# Patient Record
Sex: Female | Born: 2000 | Race: Black or African American | Hispanic: No | Marital: Single
Health system: Southern US, Community
[De-identification: ages and names within clinical notes are randomized; demographics above are authoritative.]

## PROBLEM LIST (undated history)

## (undated) DIAGNOSIS — R569 Unspecified convulsions: Secondary | ICD-10-CM

## (undated) DIAGNOSIS — F32A Depression, unspecified: Secondary | ICD-10-CM

## (undated) DIAGNOSIS — F329 Major depressive disorder, single episode, unspecified: Secondary | ICD-10-CM

## (undated) DIAGNOSIS — F4310 Post-traumatic stress disorder, unspecified: Secondary | ICD-10-CM

## (undated) DIAGNOSIS — F909 Attention-deficit hyperactivity disorder, unspecified type: Secondary | ICD-10-CM

---
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
## 2020-06-07 ENCOUNTER — Emergency Department: Admit: 2020-06-07 | Payer: PRIVATE HEALTH INSURANCE

## 2020-06-07 ENCOUNTER — Encounter: Admit: 2020-06-07 | Payer: PRIVATE HEALTH INSURANCE | Attending: Family

## 2020-06-07 ENCOUNTER — Inpatient Hospital Stay: Admit: 2020-06-07 | Discharge: 2020-06-08 | Payer: MEDICAID

## 2020-06-07 DIAGNOSIS — R197 Diarrhea, unspecified: Secondary | ICD-10-CM

## 2020-06-07 DIAGNOSIS — R1011 Right upper quadrant pain: Secondary | ICD-10-CM

## 2020-06-07 MED ORDER — MORPHINE 4 MG/ML INTRAVENOUS SOLUTION
4 mg/mL | Freq: Once | INTRAVENOUS | Status: CP
Start: 2020-06-07 — End: ?
  Administered 2020-06-08: 04:00:00 4 mL via INTRAVENOUS

## 2020-06-07 MED ORDER — SODIUM CHLORIDE 0.9 % BOLUS (NEW BAG)
0.9 % | Freq: Once | INTRAVENOUS | Status: CP
Start: 2020-06-07 — End: ?
  Administered 2020-06-08: 04:00:00 0.9 mL/h via INTRAVENOUS

## 2020-06-08 ENCOUNTER — Emergency Department: Admit: 2020-06-08 | Payer: PRIVATE HEALTH INSURANCE

## 2020-06-08 ENCOUNTER — Inpatient Hospital Stay: Admit: 2020-06-08 | Payer: PRIVATE HEALTH INSURANCE

## 2020-06-08 DIAGNOSIS — R1011 Right upper quadrant pain: Secondary | ICD-10-CM

## 2020-06-08 DIAGNOSIS — R197 Diarrhea, unspecified: Secondary | ICD-10-CM

## 2020-06-08 LAB — URINALYSIS WITH CULTURE REFLEX      (BH LMW YH)
BKR BILIRUBIN, UA: NEGATIVE
BKR BLOOD, UA: NEGATIVE % (ref 17.0–50.0)
BKR GLUCOSE, UA: NEGATIVE
BKR KETONES, UA: NEGATIVE
BKR LEUKOCYTE ESTERASE, UA: NEGATIVE % (ref 0.0–5.0)
BKR NITRITE, UA: NEGATIVE
BKR PH, UA: 7.5 (ref 5.5–7.5)
BKR SPECIFIC GRAVITY, UA: 1.024 g/dL (ref 1.005–1.030)
BKR UROBILINOGEN, UA: <2 EU/dL (ref <=2.0)
BKR WAM RDW-CV: 7.5 % (ref 5.5–7.5)

## 2020-06-08 LAB — CBC WITH AUTO DIFFERENTIAL
BKR WAM ABSOLUTE IMMATURE GRANULOCYTES.: 0.05 x 1000/??L (ref 0.00–0.30)
BKR WAM ABSOLUTE LYMPHOCYTE COUNT.: 1.85 x 1000/??L (ref 0.60–3.70)
BKR WAM ABSOLUTE NRBC (2 DEC): 0 x 1000/??L (ref 0.00–1.00)
BKR WAM ANALYZER ANC: 7.18 x 1000/??L (ref 2.00–7.60)
BKR WAM BASOPHIL ABSOLUTE COUNT.: 0.03 x 1000/??L (ref 0.00–1.00)
BKR WAM BASOPHILS: 0.3 % (ref 0.0–1.4)
BKR WAM EOSINOPHIL ABSOLUTE COUNT.: 0.12 x 1000/??L (ref 0.00–1.00)
BKR WAM EOSINOPHILS: 1.2 % (ref 0.0–5.0)
BKR WAM HEMATOCRIT (2 DEC): 37.5 % (ref 35.00–45.00)
BKR WAM HEMOGLOBIN: 12.6 g/dL (ref 11.7–15.5)
BKR WAM IMMATURE GRANULOCYTES: 0.5 % (ref 0.0–1.0)
BKR WAM LYMPHOCYTES: 18 % (ref 17.0–50.0)
BKR WAM MCH (PG): 30.1 pg (ref 27.0–33.0)
BKR WAM MCHC: 33.6 g/dL (ref 31.0–36.0)
BKR WAM MCV: 89.7 fL (ref 80.0–100.0)
BKR WAM MONOCYTE ABSOLUTE COUNT.: 1.07 x 1000/??L — ABNORMAL HIGH (ref 0.00–1.00)
BKR WAM MONOCYTES: 10.4 % (ref 4.0–12.0)
BKR WAM MPV: 10.7 fL (ref 8.0–12.0)
BKR WAM NEUTROPHILS: 69.6 % (ref 39.0–72.0)
BKR WAM NUCLEATED RED BLOOD CELLS: 0 % (ref 0.0–1.0)
BKR WAM PLATELETS: 230 x1000/??L (ref 150–420)
BKR WAM RED BLOOD CELL COUNT.: 4.18 M/ÂµL (ref 4.00–6.00)
BKR WAM WHITE BLOOD CELL COUNT: 10.3 x1000/ÂµL (ref 4.0–11.0)

## 2020-06-08 LAB — BASIC METABOLIC PANEL
BKR ANION GAP: 10 g/dL (ref 7–17)
BKR AST/ALT RATIO: 141 mmol/L (ref 136–144)
BKR BUN / CREAT RATIO: 13 (ref 8.0–23.0)
BKR CALCIUM: 9 mg/dL (ref 8.8–10.2)
BKR CHLORIDE: 106 mmol/L (ref 98–107)
BKR CREATININE: 0.69 mg/dL (ref 0.40–1.30)
BKR EGFR (AFR AMER): >60 mL/min/{1.73_m2} (ref >60)
BKR EGFR (NON AFRICAN AMERICAN): >60 mL/min/{1.73_m2} (ref >60)
BKR GLUCOSE: 94 mg/dL (ref 70–100)
BKR POTASSIUM: 4.4 mmol/L (ref 3.3–5.3)
BKR SODIUM: 141 mmol/L (ref 136–144)

## 2020-06-08 LAB — LIPASE
BKR BILIRUBIN DIRECT: 35 U/L (ref 11–55)
BKR LIPASE: 35 U/L (ref 11–55)

## 2020-06-08 LAB — HEPATIC FUNCTION PANEL
BKR A/G RATIO: 1.9 (ref 1.0–2.2)
BKR ALANINE AMINOTRANSFERASE (ALT): 16 U/L (ref 10–35)
BKR ALBUMIN: 4.4 g/dL (ref 3.6–4.9)
BKR ALKALINE PHOSPHATASE: 88 U/L (ref 9–122)
BKR ASPARTATE AMINOTRANSFERASE (AST): 21 U/L (ref 10–35)
BKR BILIRUBIN TOTAL: 0.2 mg/dL (ref <=1.2)
BKR CO2: 2.3 g/dL (ref 2.3–3.5)
BKR GLOBULIN: 2.3 g/dL (ref 2.3–3.5)
BKR PROTEIN TOTAL: 6.7 g/dL (ref 6.6–8.7)

## 2020-06-08 LAB — UA REFLEX CULTURE

## 2020-06-08 LAB — SARS COV-2 (COVID-19) RNA-~~LOC~~ LABS (BH GH LMW YH): BKR SARS-COV-2 RNA (COVID-19) (YH): NEGATIVE

## 2020-06-08 MED ORDER — OXYCODONE IMMEDIATE RELEASE 5 MG TABLET
5 mg | ORAL | Status: DC | PRN
Start: 2020-06-08 — End: 2020-06-09
  Administered 2020-06-08 – 2020-06-09 (×2): 5 mg via ORAL

## 2020-06-08 MED ORDER — SODIUM CHLORIDE 0.9 % (FLUSH) INJECTION SYRINGE
0.9 % | Freq: Three times a day (TID) | INTRAVENOUS | Status: DC
Start: 2020-06-08 — End: 2020-06-09

## 2020-06-08 MED ORDER — SODIUM CHLORIDE 0.9 % INTRAVENOUS SOLUTION
INTRAVENOUS | Status: DC
Start: 2020-06-08 — End: 2020-06-09
  Administered 2020-06-08: 09:00:00 via INTRAVENOUS

## 2020-06-08 MED ORDER — ACETAMINOPHEN 325 MG TABLET
325 mg | Freq: Four times a day (QID) | ORAL | Status: DC | PRN
Start: 2020-06-08 — End: 2020-06-09
  Administered 2020-06-08: 16:00:00 325 mg via ORAL

## 2020-06-08 MED ORDER — IOHEXOL (OMNIPAQUE) 350 MG/ML ORAL SOLUTION 25 ML IN WATER 900 ML
Freq: Once | ORAL | Status: CP
Start: 2020-06-08 — End: ?
  Administered 2020-06-08: 09:00:00 900.000 mL via ORAL

## 2020-06-08 MED ORDER — SODIUM CHLORIDE 0.9 % BOLUS (NEW BAG)
0.9 % | Freq: Once | INTRAVENOUS | Status: CP
Start: 2020-06-08 — End: ?
  Administered 2020-06-08: 12:00:00 0.9 mL/h via INTRAVENOUS

## 2020-06-08 MED ORDER — SODIUM CHLORIDE 0.9 % (FLUSH) INJECTION SYRINGE
0.9 % | INTRAVENOUS | Status: DC | PRN
Start: 2020-06-08 — End: 2020-06-09

## 2020-06-08 MED ORDER — MORPHINE 4 MG/ML INTRAVENOUS SOLUTION
4 mg/mL | SUBCUTANEOUS | Status: DC | PRN
Start: 2020-06-08 — End: 2020-06-09

## 2020-06-08 MED ORDER — TRAMADOL 50 MG TABLET
50 mg | Freq: Four times a day (QID) | ORAL | Status: DC | PRN
Start: 2020-06-08 — End: 2020-06-09

## 2020-06-08 MED ORDER — MORPHINE 4 MG/ML INTRAVENOUS SOLUTION
4 mg/mL | Freq: Once | INTRAVENOUS | Status: CP
Start: 2020-06-08 — End: ?
  Administered 2020-06-08: 09:00:00 4 mL via INTRAVENOUS

## 2020-06-08 MED ORDER — IOHEXOL 350 MG IODINE/ML INTRAVENOUS SOLUTION
350 mg iodine/mL | Freq: Once | INTRAVENOUS | Status: CP | PRN
Start: 2020-06-08 — End: ?
  Administered 2020-06-08: 12:00:00 350 mL via INTRAVENOUS

## 2020-06-08 NOTE — ED Notes
11:03 AMPatient presents with c/o RUQ abdominal pain x3 days with associated nausea and soft formed stool. Denies n/v, dysuria and other urinary symptoms including hematuria. LMP x5 days.

## 2020-06-08 NOTE — H&P
[facility]	History & PhysicalHistory provided by: the patientHistory limited by: no limitationsHistory of Present Illness:  CC: R sided abdominal painHPI: 19 yo F with no significant PMHx presented to the hospital with R sided abdominal painPatient was seen and examined around 8:20am on Nov 29 2021Patient reports that she was in her USOH until 2 days prior to presenting to the hospital when she started having R sided abdominal pain. It would come and go initially, but then it became more consistent, as bad as 9/10 in severity. Now it is radiating to the back as well. It is stabbing in quality. Worse when she takes a deep breath or when she moves. Denies trauma. Has been drinking water and that does not make it worse. She does not remember if eating food makes it better or worse. Also has been having intermittent water diarrhea with formed stool the last few days before coming in. The day of admission, she had bright red blood mixed with the stool as well. Never had blood in stool before. Never had pain like this before. No family hx of IBD. Does not use any medications or over the counter supplements. Not on NSAIDs. Denies acid reflux, but sometime wakes up with metalic taste at back of her mouth in the morning. No sick contacts. No nausea. Does not have vomiting (had one episode of vomiting in the ED after oral contrast, but relates it to the oral [place] contrast). No fevers. No chills. Feels SOB because can not take deep breaths due to the pain. No chest pain. Is sexually active with female partner. Denies vaginal discharge. Never had an STD. Last menstrual period finished Nov 24. Her menstrual periods are regular every month. In the ED, vitals: temp 97.66F, HR 74, RR 18, BP 110/71, saturating 100% on room airCMP normalWBC 10.3, no left shiftUA wo signs of infectionTransvaginal [place] with nonspecific mild thickening and hyperemia of the left fallopian tube, no ovarian torsion. US appendix is normalUS RUQ with mild 7mm dilatation of the proximal CBD, without obstructing stone. covid negativeCt abd/pelvis with IV contrast done and pending readPatient received morphine 4mg  IVx2, 1L NS and admitted to medicine. GI team has been consulted. Medical History: History reviewed. No pertinent past medical history.No past medical history pertinent negatives. History reviewed. No pertinent family history. History reviewed. No pertinent surgical history.No past surgical history pertinent negatives on file. Social History Socioeconomic History ? Marital status: Single   Spouse name: Not on file ? Number of children: Not on file ? Years of education: Not on file ? Highest education level: Not on file Occupational History ? Not on file Tobacco Use ? Smoking status: Current Every Day Smoker   Packs/day: 0.50   Types: Cigarettes ? Smokeless tobacco: Never Used Substance and Sexual Activity ? Alcohol use: Not on file ? Drug use: Not on file ? Sexual activity: Yes   Partners: Female Other Topics Concern ? Not on file Social History Narrative ? Not on file Social Determinants of Health Financial Resource Strain:  ? Difficulty of Paying Living Expenses:  Food Insecurity:  ? Worried About Programme researcher, broadcasting/film/video in the Last Year:  ? Barista in the Last Year:  Transportation Needs:  ? Freight forwarder (Medical):  ? Lack of Transportation (Non-Medical):  Physical Activity:  ? Days of Exercise per Week:  ? Minutes of Exercise per Session:  Stress:  ? Feeling of Stress :  Social Connections:  ? Frequency of  Communication with Friends and Family:  ? Frequency of Social Gatherings with Friends and Family:  ? Attends Religious Services:  ? Active Member of Clubs or Organizations:  ? Attends Banker Meetings:  ? Marital Status:  Intimate Partner Violence:  ? Fear of Current or Ex-Partner:  ? Emotionally Abused:  ? Physically Abused:  ? Sexually Abused:   Home Medications: noneAllergies as of 06/07/2020 ? (No Known Allergies) Review of Systems: GENERAL:CONSTITUTIONAL: no fevers, no chills. Feels weaknessHEAD: no headacheEYES: no acute visual changesNOSE: no nasal dischargeMOUTH & THROAT: no sore throatPULMONARY: no cough. Feels SOB because can not take a deep breath due to R sided abdominal painCARDIOVASCULAR: no chest pain, no leg swellingGASTROINTESTINAL: has severe R sided abdominal pain (mostly upper quadrant, but some in the lower quadrant as well). Also with diarrhea. Did have blood in the stool the day of presentation. No nasuea or vomiting (did have an episode of vomiting after the oral [place] contrast given in the ED)MUSCULOSKELETAL:  RUQ abdominal pain radiates to the backSKIN: has some hyperpigmented lesions which are chronic such as under chin and in her back, get worse in the summerNEUROLOGICAL: no focal weaknessHEMATOLOGIC: did have bright red blood per rectumObjective: Vitals: Temp:  [97.8 ?F (36.6 ?C)] 97.8 ?F (36.6 ?C)Pulse:  [70-85] 74Resp:  [16-18] 16BP: (104-112)/(50-71) 110/52SpO2:  [98 %-100 %] 98 %Device (Oxygen Therapy): room air Weight: 79.5 kg Physical Exam:Gen: lying in bed in NAD, AOx3HEENT: EOMI, MMMCV: RRR, no murmurPulm: clear to auscultation anteriorlyAbd: Soft, non distended, BS present. Tender to palpation in the epigastric, RUQ, RLQ. No tender to palpation in the LUQ or LLQExt: No pitting edemaNeuro: A&Ox3, moving all extremitiesSkin: hyperpigmented lesion in the chin noted. Did not do full skin exam Labs: Recent Labs Lab 11/28/212109 WBC 10.3 HGB 12.6 HCT 37.50 PLT 230  Recent Labs Lab 11/28/212109 NEUTROPHILS 69.6  Recent Labs Lab 11/28/212109 NA 141 K 4.4 CL 106 CO2 25 BUN 9 CREATININE 0.69 GLU 94 ANIONGAP 10  Recent Labs Lab 11/28/212109 CALCIUM 9.0  Recent Labs Lab 11/28/212109 ALT 16 AST 21 ALKPHOS 88 BILITOT 0.2 BILIDIR <0.2 PROT 6.7 ALBUMIN 4.4  No results for input(s): PTT, LABPROT, INR in the last 168 hours. Recent Labs   11/29/210022 11/29/210028 PHUR 7.5  --  PROTEINUA Trace  --  NITRITE Negative Negative  Microbiology:covid negativeDiagnostics:[place] Abdomen Pelvis w IV Contrast with oral contrastAddendum Date: 06/08/2020  ** ADDENDUM #1 ** Addendum: Note is made of the possibility of kidney stones. The ureters are difficult to follow throughout their course due to lack of intraperitoneal fat. There are a few calcifications in the left hemipelvis and a single calcification in the right hemipelvis (image 584, series 3), of uncertain location but no evidence of upstream hydronephrosis or hydroureter. Also note that comparison should also read: Comparison: None. Reported And Signed By: Imogene Burn, MD  Result Date: 11/29/2021[facility]** ORIGINAL REPORT ** [place] ABDOMEN PELVIS W IV CONTRAST on 06/08/2020 6:59 AM INDICATION: RUQ pain, also consider kidney stone. COMPARISON: TECHNIQUE: [place] images of the abdomen and pelvis were obtained after the administration of 80 cc intravenous contrast (Omnipaque 350). Oral contrast was administered. FINDINGS: Lower Thorax: Unremarkable. Liver: Unremarkable. Biliary/Gallbladder: Gallbladder is fluid-filled. No Brenas evidence of gallstones, gallbladder wall thickening, or pericholecystic fluid. CBD is mildly dilated to 0.8 cm. Pancreas: Unremarkable. Spleen: Unremarkable. Adrenal glands: Unremarkable. Kidneys and Bladder: Right lower pole renal cyst. Bladder is partially fluid-filled and is unremarkable. Vascular: Abdominal aorta is normal in caliber. Lymph nodes:  Unremarkable. Bowel: No bowel obstruction. Appendix is unremarkable. Reproductive Organs: The uterus is retropositioned. The ovaries are difficult to delineate due to lack of intraperitoneal fat and adjacent bowel loops. Peritoneum/Retroperitoneum: Unremarkable. Musculoskeletal system and soft tissue: No aggressive osseous lesion. 1. CBD is mildly dilated to 0.8 cm, of uncertain cause. If clinically indicated this could be further evaluated with MRCP and/or ERCP. 2. Appendix is within normal limits. 3. Ovaries are poorly delineated due to lack of intraperitoneal fat and adjacent bowel loops. If there is clinical concern for ovarian pathology, pelvic ultrasound may be considered. Reported And Signed By: Imogene Burn, MD  [facility] Radiology and Biomedical ImagingUS AppendixResult Date: 11/29/2021Appendicitis score: 1. Completely visualized normal appendix. Alternative/additional diagnosis: None Report Initiated By:  Keane Scrape, MD Reported And Signed By: Dion Saucier, MD  [facility] Radiology and Biomedical ImagingUS Non-OB Transvaginal with Limited DopplerResult Date: 11/29/20211.  No evidence of ovarian torsion. 2.  Nonspecific mild thickening and hyperemia of the left fallopian tube. Correlate clinically for possibility of salpingitis. Report Initiated By:  Keane Scrape, MD Reported And Signed By: Dion Saucier, MD  [facility] Radiology and Biomedical ImagingUS Right Upper QuadrantResult Date: 11/29/2021Mild 7 mm dilatation of the proximal CBD, without evidence of obstructing stone. No intrahepatic biliary duct dilatation. If there is clinical concern for choledocholithiasis, further evaluation with MRCP could be considered. No cholelithiasis. Nonspecific trace pericholecystic free fluid. Otherwise, there is no ultrasound evidence of cholecystitis. Report Initiated By:  Keane Scrape, MD Reported And Signed By: Dion Saucier, MD  [facility] Radiology and Biomedical ImagingAssessment & Plan: 19 yo F with no significant PMHx presented to the hospital with R sided abdominal painLFTs normal. Appendix normal in [place] and [place] scan. Gallbladder looks ok. Lip[ase normal at 28. Did consider a possible ulcer in the small instestine but patient does not suffer from acid reflux or abdominal pain on the regular basis. Also possible colitis in setting of diarrhea and blood in the stool. But no colitis finding on [place] scan. Will check stool studies if she has more diarrheaDid think of a possible right lower lung clot or right lower lung pneumonia causing referred pain to the right upper quadrant, but the lower lung fields look ok in the [place]. Also no personal or family histoyr of clotsKidney stones on differrential but none seen on [place] abd/pelvisAssessment Plan: - will discuss the findings with GI team- tylenol prn for mild pain, tramadol prn for moderate pain. Oxycodone for severe pain. Morphine for severe pain not responding to oxycodoneInflammation of the left salpix- unclear significance. No tenderness on that side. No vaginal discharge. No urinary symptoms. No history of STDs- continue to monitorDIET: NPO while discussing case with GIPPX: SCDsDISPO:  pendingCODE STATUS: presumed full code Primary Emergency Contact: Candice Camp, Home Phone: [phone]: PCP: Pcp, Does Not Have A Primary Care Provider was notified of this admission. No. No PCP listedFamily was notified of this admission. Yes. Mom updated around 3:30pmPlan discussed with patient and/or family. YesSigned:Chawn Spraggins, MD, MScHospitalist MedicineCell phone: MHBNovember 29, 20217:22 AMAddendum 9:50amCT abd/pelvis revised to say that there might be R sided kidney stone which could explain the symptomsDiscussed cas with GI. The slight dilatation in the CBD is not really that impressive according to the GI team. We want to resume diet today and monitor how she does overnight. Jone Baseman MDAddendum 3:30pmSaw patient again. He was still having about 8/10 R sided painBlerta Nasia Cannan MDAddendum 6:30pmDiscussed case with radiology to review the [place] abd/pelvis finding. The radiology attending was not even sure  that the small calcification in the R hemipelvis was a kidney stone versus just a small calcification in a vessel. If it was a kidney stone, it is only 2mm in size so it will pass on its own. They recommended that if the abdominal pain does not improve, we can consider an MRCP to evaluate the slight dilatation of the CBDBlerta Adrieanna Boteler MD

## 2020-06-08 NOTE — Utilization Review (ED)
UM Status: Other self pay, abdominal pain, CBD dilatation, IV analgesia, [place] abdomen

## 2020-06-08 NOTE — ED Notes
7:30 AM Pt returned from [place] scan, currently resting in stretcher at ease. Appears NAD at this time. Awaiting result of [place] scan for dispo. 10:32 AM Pt ambulated to and from bathroom with steady gait. Updated of bed status & will be transferred upstairs. Provided with food per diet order.

## 2020-06-08 NOTE — Plan of Care
Plan of Care Overview/ Patient Status    1300-Care assumed after report from b side rn. Patient A/Ox4, VSS on RA. Denies sob/ C/o bearable, but hurts pain to RUQ. No interventions at this time. IV c/d/I, flushing w/o difficulty. Continent, independent to bathroom with steady gait. Skin intact. Girlfriend at bedside. Safety maintained, hourly rounding. WCTM.

## 2020-06-08 NOTE — ED Notes
4:40 AM PO contrast finished. [place] made aware. Dwell time: 2 hours

## 2020-06-09 ENCOUNTER — Encounter: Admit: 2020-06-09 | Payer: PRIVATE HEALTH INSURANCE | Attending: Internal Medicine

## 2020-06-09 DIAGNOSIS — R1031 Right lower quadrant pain: Secondary | ICD-10-CM

## 2020-06-09 DIAGNOSIS — Z20822 Contact with and (suspected) exposure to covid-19: Secondary | ICD-10-CM

## 2020-06-09 DIAGNOSIS — K921 Melena: Secondary | ICD-10-CM

## 2020-06-09 DIAGNOSIS — F17210 Nicotine dependence, cigarettes, uncomplicated: Secondary | ICD-10-CM

## 2020-06-09 DIAGNOSIS — R1011 Right upper quadrant pain: Secondary | ICD-10-CM

## 2020-06-09 NOTE — Consults
[facility]   Gastroenterology Consult Note    Reason for Consult: abdominal pain  Evaluation requested by:   Attending Provider: Roney Marion, MD MPH [phone]    Presenting History:   Brandy Griffin is a 19 y.o. female with no PMH presenting with R sided abdominal pain x 3 days.    Reports mild R upper quadrant abdominal pain x 3 days which worsened on day of presentation. Pain was initially intermittent but is now constant and stabbing. Worse with movement. Denies trauma. Not associated with PO intake. Endorses diarrhea/soft stool with 3-4 episodes per day x 2 days. Reports blood mixed with stool yesterday now resolved. No prior hx of hematochezia or melena. No recent travel. No NSAIDs, no recent abx. No family history of IBD.    In the ED, afebrile, HDS. CMP, CBC, lipase wnl. UA wnl. TVUS with no e/o ovarian torsion, nonspecific mild thickening and hyperemia of the L fallopian tube. RUQUS with mild 7 mm dilatation of the proximal CBD without e/o stone, no intrahepatic biliary ductal dilatation, no cholelithiasis or sludge, no GB wall thickening. Dosed morphine IV 4 mg x 2 with minimal improvement.    Review of Systems:   A 10 point review of systems was performed and is negative except pertinent findings in HPI.    Review of Medical/Family/Surgical/Social History/Medications:     History reviewed. No pertinent past medical history.  No past medical history pertinent negatives. History reviewed. No pertinent family history.   History reviewed. No pertinent surgical history.  No past surgical history pertinent negatives on file. Social History     Tobacco Use   ? Smoking status: Current Every Day Smoker     Packs/day: 0.50     Types: Cigarettes   ? Smokeless tobacco: Never Used   Substance Use Topics   ? Alcohol use: Not on file   ? Drug use: Not on file        Outpatient Medications:  No current facility-administered medications on file prior to encounter.     No current outpatient medications on file prior to encounter.       Objective:   Vitals:  Vitals:    06/08/20 0144 06/08/20 0150 06/08/20 0331 06/08/20 0345   BP: (!) 104/50   (!) 110/52   Pulse: 85   74   Resp: 16      Temp:       TempSrc:       SpO2: 100%   98%   Weight:  79.5 kg     Height:   5' 8 (1.727 m)        Physical Exam:  GENERAL: NAD, lying in bed  HEENT: MMM  CARDIOVASCULAR: RRR  PULMONARY: No respiratory distress on RA  GASTROINTESTINAL: Bowel Sounds Present, Soft, nondistended, TTP over RUQ without rebound or guarding  RECTAL: per ED brown stool  EXTREMITIES: No  edema  SKIN: No rash  PSYCH/NEURO: Alert and Oriented x 3, normal mood and affect     Medications:  SCHEDULED:  Current Facility-Administered Medications   Medication Dose Route Frequency Provider Last Rate Last Admin       PRN:      Labs:    Recent Labs   Lab 06/07/20  2109   WBC 10.3   HGB 12.6   HCT 37.50   PLT 230    Recent Labs   Lab 06/07/20  2109   NEUTROPHILS 69.6  Recent Labs   Lab 06/07/20  2109   NA 141   K 4.4   CL 106   CO2 25   BUN 9   CREATININE 0.69   GLU 94   ANIONGAP 10    Recent Labs   Lab 06/07/20  2109   CALCIUM 9.0      Recent Labs   Lab 06/07/20  2109   ALT 16   AST 21   ALKPHOS 88   BILITOT 0.2   BILIDIR <0.2   PROT 6.7   ALBUMIN 4.4    No results for input(s): PTT, LABPROT, INR in the last 168 hours.     No results found for: IRON, TIBC, FERRITIN     Imaging:  US Appendix    Result Date: 06/08/2020  EXAM: Ultrasound Appendix, 06/08/2020 1:29 AM  HISTORY: Right lower quadrant abdominal pain, concern for appendicitis.  COMPARISON: None. TECHNIQUE: Grayscale and color Doppler sonographic images with and without graded compression were obtained with attention to the right lower quadrant. FINDINGS: Appendix: -Visualized: Completely -Mobility: Mobile -Fluid-filled: No -Compressible: Yes -Maximum diameter with compression (outer wall to outer wall): 0.56 -Appendicolith: No -Wall --Hyperemia: No --Thickening (>3mm): No --Loss of mural stratification: No Free fluid: Trace Increased echogenicity of periappendiceal fat: No Abscess: No Additional findings: None     Appendicitis score: 1. Completely visualized normal appendix. Alternative/additional diagnosis: None Report Initiated By:  Keane Scrape, MD Reported And Signed By: Dion Saucier, MD  [facility]iology and Biomedical Imaging    [place] Non-OB Transvaginal with Limited Doppler    Result Date: 06/08/2020  [place] NON-OB TRANSVAGINAL WITH LIMITED DOPPLER ([facility] LM [facility]) INDICATION: ovarian torsion? COMPARISON: No prior studies are available for comparison. TECHNIQUE: Transvaginal ultrasound of pelvis was performed with gray scale color, and pulsed Doppler. FINDINGS: The uterus measures 7.0 x 2.8 x 5.0 cm. No focal masses are seen. The endometrial stripe measures 0.1 mm and is within normal limits. The right ovary has a normal morphologic appearance and measures 3.8 x 2.5 x 2.0 cm. There is normal flow on color Doppler. Pulsed Doppler demonstrates normal flow with a resistive index of 0.61. A 1.2 x 1.0 x 0.9 cm hypoechoic structure with thick wall within the right ovary is indeterminate, but likely represents a corpus luteal cyst. Nonspecific mild thickening of the left fallopian tube is noted, with associated mild hyperemia on color Doppler imaging. The left ovary has a normal morphologic appearance and measures 3.6 x 2.0 x 2.1 cm. There is normal flow on color Doppler. Pulsed Doppler demonstrates normal flow with a resistive index of 0.53. There is small volume free fluid in the pelvis. Incidentally partially imaged with appendix adjacent to the right ovary.     1.  No evidence of ovarian torsion. 2.  Nonspecific mild thickening and hyperemia of the left fallopian tube. Correlate clinically for possibility of salpingitis. Report Initiated By:  Keane Scrape, MD Reported And Signed By: Dion Saucier, MD  [facility] Radiology and Biomedical Imaging    [place] Right Upper Quadrant    Result Date: 06/08/2020  [place] RIGHT UPPER QUADRANT ([facility] LM WH) INDICATION: RUQ TTP COMPARISON: No prior similar studies are available for comparison. TECHNIQUE: Wallace Cullens scale and color Doppler imaging were utilized. FINDINGS: The liver is heterogeneous in echotexture. No focal masses are seen. There is no evidence of intrahepatic biliary ductal dilation.  No perihepatic fluid is identified. The gallbladder demonstrates no evidence of intraluminal calculi or sludge. There is  no wall thickening. There is trace pericholecystic fluid. No sonographic Murphy's sign was elicited. The common bile duct is mildly dilated measuring 0.7 cm. No intraluminal stones are noted along the visualized course of the duct. The right kidney measures 11.7 cm. A 1.3 x 0.9 cm lower pole cyst is noted. There is no hydronephrosis, or nephrolithiasis. The head of the pancreas appears unremarkable. The pancreatic body and tail were not well visualized secondary to overlying bowel gas. The proximal abdominal aorta is normal in caliber, measuring 1.6 cm. The retrohepatic IVC is patent.     Mild 7 mm dilatation of the proximal CBD, without evidence of obstructing stone. No intrahepatic biliary duct dilatation. If there is clinical concern for choledocholithiasis, further evaluation with MRCP could be considered. No cholelithiasis. Nonspecific trace pericholecystic free fluid. Otherwise, there is no ultrasound evidence of cholecystitis. Report Initiated By:  Keane Scrape, MD Reported And Signed By: Dion Saucier, MD  [facility] Radiology and Biomedical Imaging        Procedural:  No prior EGD/colonoscopy    Impression:   Brandy Griffin is a 19 y.o. female with no PMH presenting with RUQ abdominal pain x 3 days. Lab work unremarkable and imaging notable for mild 7 mm dilatation of the proximal CBD but no e/o cholelithiasis or sludge, no e/o cholecystitis. Unlikely pain is related to biliary/GB pathology given imaging and normal liver enzymes. She reports one episode of hematochezia but reassuringly hgb is stable and rectal exam with brown stool. Differential includes infectious colitis, nephrolithiasis, MSK, less likely inflammatory colitis.     Recommendations:   - Send stool pathogen panel, cdiff  - Continue to monitor for signs of GI bleed  - F/u CTAP  - Role for endoscopic evaluation pending imaging and clinical course    Case discussed with Dr.Hung, final attending attestation to follow. Please do not hesitate to contact our team with any questions or concerns    Hilda Blades, MD  [place] [facility]  Best Contact: Mobile [facility]   [phone] after 5pm and weekends, please contact on-call GI fellow as listed on Amion

## 2020-06-09 NOTE — Other
PHARMACY-ASSISTED MEDICATION REPORT    Pharmacist review of the best possible medication history obtained by the pharmacy medication history technician has been performed.      I have updated the home medication list and identified the following information that may be relevant to this admission.      NOTES/RECOMMENDATIONS   None at this time.                     Prior to Admission Medications     Medication Name Sig Taking? Patient Reported        * No prior to admission medications found for this patient. *      Prior to admission medications last reviewed by Ernst Bowler, CPHT on Mon Jun 08, 2020 9811         Thank you,  Theodosia Quay, PharmD  06/08/2020  10:40 PM  Phone: [facility]

## 2020-06-09 NOTE — Plan of Care
Plan of Care Overview/ Patient Status    2030-2300PT A&Ox4. Arrived to unit requesting that girlfriend stay overnight. This RN explained visitor restrictions set in place per [place]. Patient stated she would like to leave AMA. Provider at bedside, pt signed AMA forms.

## 2020-06-09 NOTE — ED Notes
8:20 PM Pt A&Ox4 VSS, tolerating RA.  RUQ sharp stabbing pain.  Oxycodone x1 w/ pending results.  Indpendent w/ mobility.   .  Pt denies nausea.   Significant other w/ pt.

## 2020-06-11 NOTE — Discharge Summary
[facility]    Med/Surg Discharge Summary    Patient Data:    Patient Name: Brandy Griffin Admit date: 06/07/2020   Age: 19 y.o. Discharge date: 06/08/2020   DOB: 2000/07/17  Discharge Attending Physician: Jone Baseman MD   MRN: [id number]  Discharged Condition: fair   PCP: Pcp, Does Not Have A Disposition: left AMA     Principal Diagnosis: Right upper and right lower quadrant abdominal pain  Secondary Diagnoses:        Issues to be Addressed Post Discharge:     Issues to be Addressed Post Discharge:  1. We had done extensive evaluation of R sided abdominal pain, but we still did not have a good diagnosis to explain the pain. The plan was to monitor how the patient was going to do overnight and then decide on further tests. She also had blood in her stool and needs GI follow up  2.   3.     Relevant Medications on Discharge:  none    Pending Labs and Tests:     Follow-up Information:  No follow-up provider specified.     No future appointments.     I see that the GI team has placed referral for patient to follow up with GI    Hospital Course:     Hospital Course:     19 yo F with no significant PMHx presented to the hospital with R sided abdominal pain  ?  Patient reports that she was in her USOH until 2 days prior to presenting to the hospital when she started having R sided abdominal pain. It would come and go initially, but then it became more consistent, as bad as 9/10 in severity. Now it is radiating to the back as well. It is stabbing in quality. Worse when she takes a deep breath or when she moves. Denies trauma. Has been drinking water and that does not make it worse. She does not remember if eating food makes it better or worse. Also has been having intermittent water diarrhea with formed stool the last few days before coming in. The day of admission, she had bright red blood mixed with the stool as well. Never had blood in stool before.   ?  Never had pain like this before. No family hx of IBD. Does not use any medications or over the counter supplements. Not on NSAIDs. Denies acid reflux, but sometime wakes up with metalic taste at back of her mouth in the morning. No sick contacts. No nausea. Does not have vomiting (had one episode of vomiting in the ED after oral contrast, but relates it to the oral [place] contrast). No fevers. No chills. Feels SOB because can not take deep breaths due to the pain. No chest pain.   ?  Is sexually active with female partner. Denies vaginal discharge. Never had an STD. Last menstrual period finished Nov 24. Her menstrual periods are regular every month.   ?  In the ED, vitals: temp 97.42F, HR 74, RR 18, BP 110/71, saturating 100% on room air  CMP normal  WBC 10.3, no left shift  UA wo signs of infection  Transvaginal US with nonspecific mild thickening and hyperemia of the left fallopian tube, no ovarian torsion.   US appendix is normal  [place] RUQ with mild 7mm dilatation of the proximal CBD, without obstructing stone.   covid negative  [place] abd/pelvis with IV contrast done and did not show colitis.   Patient  received morphine 4mg  IVx2, 1L NS and admitted to medicine. GI team has been consulted.     We did not have a good explanation of the pain. Liver function not elevated. Gallbladder did not look inflamed. No colitis seen on [place] scan final read. She might have had an ulcer in the small intestine which can cause pain, but less likely. The revised [place] read said that there was a possible 2mm calcification in the Right hemipelvis. Unclear if it was in a ureter or in a vessel. If this represented a kidney stone, it could explain the severe acute symptoms. The plan was to monitor patient overnight and see how her pain progresses. If it continued, we were going to consider an MRCP to help evaluate the pain. We were also planning to check infectious stool studies.     When she arrived to a regular medicine floor, visitors were not allowed. She wanted her partner to stay which was not possible per visitor guidelines. So patient decided to leave AMA.  I see that the GI team has placed a referral to the outpatient gastroenterology team so patient can have follow up.       Inpatient Consultants and summary of recommendations:    GI saw patient and recommended infectious stool studies. If the right upper quadrant pain is persistent, they also recommended MCRP. Further, they recommended follow up for the patient    Pertinent Procedures or Surgeries:   none    Pertinent lab findings and test results:   Objective:     Recent Labs   Lab 06/07/20  2109   WBC 10.3   HGB 12.6   HCT 37.50   PLT 230    Recent Labs   Lab 06/07/20  2109   NEUTROPHILS 69.6      Recent Labs   Lab 06/07/20  2109   NA 141   K 4.4   CL 106   CO2 25   BUN 9   CREATININE 0.69   GLU 94   ANIONGAP 10    Recent Labs   Lab 06/07/20  2109   CALCIUM 9.0      Recent Labs   Lab 06/07/20  2109   ALT 16   AST 21   ALKPHOS 88   BILITOT 0.2   BILIDIR <0.2    No results for input(s): PTT, LABPROT, INR in the last 168 hours.     Culture Information:  covid negative    Imaging:   Imaging results last 1 week:   Abdomen Pelvis w IV Contrast with oral contrast    Addendum Date: 06/08/2020    ** ADDENDUM #1 ** Addendum: Note is made of the possibility of kidney stones. The ureters are difficult to follow throughout their course due to lack of intraperitoneal fat. There are a few calcifications in the left hemipelvis and a single calcification in the right hemipelvis (image 584, series 3), of uncertain location but no evidence of upstream hydronephrosis or hydroureter. Also note that comparison should also read: Comparison: None. Reported And Signed By: Imogene Burn, MD      Result Date: 06/08/2020  1. CBD is mildly dilated to 0.8 cm, of uncertain cause. If clinically indicated this could be further evaluated with MRCP and/or ERCP. 2. Appendix is within normal limits. 3. Ovaries are poorly delineated due to lack of intraperitoneal fat and adjacent bowel loops. If there is clinical concern for ovarian pathology, pelvic ultrasound may be considered. Reported And Signed By: Wynona Canes  Arms, MD  [facility]    US Appendix    Result Date: 06/08/2020  Appendicitis score: 1. Completely visualized normal appendix. Alternative/additional diagnosis: None Report Initiated By:  Keane Scrape, MD Reported And Signed By: Dion Saucier, MD  [facility]    [place] Non-OB Transvaginal with Limited Doppler    Result Date: 06/08/2020  1.  No evidence of ovarian torsion. 2.  Nonspecific mild thickening and hyperemia of the left fallopian tube. Correlate clinically for possibility of salpingitis. Report Initiated By:  Keane Scrape, MD Reported And Signed By: Dion Saucier, MD  [facility]    [place] Right Upper Quadrant    Result Date: 06/08/2020  Mild 7 mm dilatation of the proximal CBD, without evidence of obstructing stone. No intrahepatic biliary duct dilatation. If there is clinical concern for choledocholithiasis, further evaluation with MRCP could be considered. No cholelithiasis. Nonspecific trace pericholecystic free fluid. Otherwise, there is no ultrasound evidence of cholecystitis. Report Initiated By:  Keane Scrape, MD Reported And Signed By: Dion Saucier, MD  [facility]      Diet:  Heart healthy diet  Mobility:                Physical Exam     Discharge vitals:   Temp:  [97.7 ?F (36.5 ?C)-98.3 ?F (36.8 ?C)] 98.1 ?F (36.7 ?C)  Pulse:  [58-87] 60  Resp:  [17-18] 18  BP: (97-115)/(43-68) 115/68  SpO2:  [100 %] 100 %  Device (Oxygen Therapy): room air   Pertinent Findings of Physical Exam: see below  Cognitive Status at Discharge: Alert and Oriented x 3    Discharge Physical Exam:  Physical Exam    Gen: lying in bed in NAD, AOx3  HEENT: EOMI, MMM  CV: RRR, no murmur  Pulm: clear to auscultation anteriorly  Abd: Soft, non distended, BS present. Tender to palpation in the epigastric, RUQ, RLQ. No tender to palpation in the LUQ or LLQ  Ext: No pitting edema  Neuro: A&Ox3, moving all extremities  Skin: hyperpigmented lesion in the chin noted. Did not do full skin exam    Allergies   No Known Allergies     PMH PSH   No past medical history on file.   No past surgical history on file.     Social History Family History   Social History     Tobacco Use   ? Smoking status: Current Every Day Smoker     Packs/day: 0.50     Types: Cigarettes   ? Smokeless tobacco: Never Used   Substance Use Topics   ? Alcohol use: Not on file      History reviewed. No pertinent family history.       Discharge Medications:   Discharge: There are no discharge medications for this patient.      I was not present when patient decided to leave AMA. I saw her earlier in the morning when I admitted her to the hospital. Will not charge for this discharge    Electronically Signed:  Jone Baseman, MD   06/09/2020   11:14 AM  Best Contact Information: [phone]

## 2020-07-07 NOTE — ED Provider Notes
History: 19 y.o. female who denies PMH presents for chief complaint of right-sided abdominal pain occurring for 3 days but worsening tonight.  Patient reports a mild right-sided abdominal pain occurring for the past 3 days but tonight it got extremely worse while she was in bed.  She describes the pain in the right upper quadrant but also in the mid right abdomen and somewhat near the right flank.  She had an episode of diarrhea/soft stool yesterday that was reportedly bloody.  She has not had a bowel movement today.  She denies blood in her urine, dysuria or urinary urgency/frequency.  Denies vaginal bleeding or discharge.  Last menstrual period ended 5 days ago.  No concern for STI.  Denies abdominal surgeries in retains gallbladder, appendix, uterus and ovaries.  Denies frequent alcohol or NSAID use.Pertinent Physical Exam: Vital signs stable and WNL.  Mildly ill appearing in obvious pain and tearful.  Abdomen is significantly tender to palpation in the right upper quadrant but also has moderate tenderness palpation in the mid right abdomen region around the right flank.  No distinct CVA tenderness bilaterally.  Rectal exam with small amount of light brown stool.  Hemoccult negative.  Cardiac and pulmonary exams are unremarkable.  DDx:  Acute cholecystitis, pancreatitis, appendicitis, urolithiasis, ovarian torsion, gastroenteritis, pregnancy, PIDMDM/Course:  CBC, CMP, lipase all unremarkable.  Patient unable to provide urine sample, so 1 L normal saline given, along with 4 mg morphine IV.  Differential is broad due to area of tenderness, so will start with right upper quadrant, renal and endovaginal ultrasounds.  Patient signed out to incoming team but may require abdominal Anna if ultrasounds and pending urinalysis do not reveal etiology of pain.Dispo:  Pending further imaging and diagnosticsDiscussed with ED attending physician Dr. Lorine Bears, PA-C Emergency Medicine Resident PAReachable on Mobile Heartbeat Documentation performed with voice-to-text software; please excuse any errors.HistoryChief Complaint Patient presents with ? Abdominal Pain   RUQ abd pain x 24 hours. No n/v.   The history is provided by the patient. IllnessThis is a new problem. The current episode started more than 2 days ago. The problem occurs constantly. The problem has been rapidly worsening. Associated symptoms include abdominal pain. Pertinent negatives include no chest pain, no headaches and no shortness of breath. Nothing aggravates the symptoms. Nothing relieves the symptoms. She has tried nothing for the symptoms.  No past medical history on file.No past surgical history on file.History reviewed. No pertinent family history.Social History Socioeconomic History ? Marital status: Single   Spouse name: Not on file ? Number of children: Not on file ? Years of education: Not on file ? Highest education level: Not on file Tobacco Use ? Smoking status: Current Every Day Smoker   Packs/day: 0.50   Types: Cigarettes ? Smokeless tobacco: Never Used Substance and Sexual Activity ? Sexual activity: Yes   Partners: Female ED Other Social History ? How many times in the past year have you had four or more drinks in a day? 0  ? Recall >3 drinks/day or >7 drinks/week (for woman and those over age 24) constitutes at-risk alcohol consumption No/Negative  ? In the past year have you used drugs or used a prescription medication for non-medical reasons? no  ? Drug Risk Screen Positive if >/=0 Negative  ? Cannabis frequency Daily Daily on 06/07/2020 E-cigarette/Vaping Substances E-cigarette/Vaping Devices Review of Systems Constitutional: Negative for chills and fever. HENT: Negative for congestion.  Respiratory: Negative for cough, chest tightness and shortness of breath.  Cardiovascular: Negative  for chest pain. Gastrointestinal: Positive for abdominal pain, blood in stool and diarrhea. Negative for nausea and vomiting. Genitourinary: Positive for flank pain. Negative for dysuria, frequency, hematuria, vaginal bleeding and vaginal discharge. Musculoskeletal: Negative for back pain. Skin: Negative for rash. Neurological: Negative for headaches. All other systems reviewed and are negative. Physical ExamED Triage Vitals [06/07/20 2052]BP: 110/71Pulse: 74Pulse from  O2 sat: n/aResp: 18Temp: 97.8 ?F (36.6 ?C)Temp src: TemporalSpO2: 100 % BP (!) 110/52  - Pulse 74  - Temp 97.8 ?F (36.6 ?C) (Temporal)  - Resp 16  - Ht 5' 8 (1.727 m)  - Wt 79.5 kg  - LMP 06/02/2020  - SpO2 98%  - BMI 26.65 kg/m? Physical ExamVitals and nursing note reviewed. Constitutional:     General: She is not in acute distress.   Appearance: Normal appearance. She is ill-appearing (in obvious pain, tearful). HENT:    Right Ear: External ear normal.    Left Ear: External ear normal.    Nose: Nose normal. Eyes:    Conjunctiva/sclera: Conjunctivae normal. Cardiovascular:    Rate and Rhythm: Normal rate and regular rhythm.    Heart sounds: Normal heart sounds. No murmur heard. No friction rub. No gallop.  Pulmonary:    Effort: Pulmonary effort is normal. No respiratory distress.    Breath sounds: Normal breath sounds. No wheezing or rhonchi. Abdominal:    General: Bowel sounds are normal. There is no distension.    Palpations: Abdomen is soft.    Tenderness: There is abdominal tenderness in the right upper quadrant and right lower quadrant. There is guarding. There is no right CVA tenderness, left CVA tenderness or rebound. Positive signs include Murphy's sign. Musculoskeletal:       General: Normal range of motion. Skin:   General: Skin is warm and dry. Neurological:    General: No focal deficit present.    Mental Status: She is alert and oriented to person, place, and time.  ProceduresProceduresResident/APP MDM:11:15 PM signout received. Worsening RUQ abd pain over 3 days. Pending UA and imaging. Consider [place] abd/pelv. Pain mgmt. Dispo: if negative imaging and pain controlled: home.Marla Roe, DNP, MPH, FNP-BCEmergency Medicine APP PGY-2Portions of this note were completed with voice recognition software.ED COURSEInterpreted by ED Provider: labs and ultrasoundPatient Reevaluation: ED Attestation: PA/APRNFace to face evaluation was performed by me in collaboration with the Advanced Practice Provider to assess for significant health threats.19 yo F with no PMH p/w 3 days of RUQ pain, with episode of bloody stool. No emesis, vaginal d/c. Rare alcohol use.  On my exam:  RUQ TTP, with mild RLQ TTP. My differential includes: cholecystitis, gastritis, PUD, appendicitis. Lennox Grumbles Leiana Rund------------------------------------------ED Attestation: PA/APRNFace to face evaluation was performed by me in collaboration with the Advanced Practice Provider to assess for significant health threats.On my exam:My differential includes:U/S unremarkable except mildly dilated proximal CBDRe-eval continues to have painD/W GICT pendingAdmit to medicine for GI eval, possible MRCPAlyssa R FrenchCritical care provided by attending: no critical carePatient progress: stableClinical Impressions as of Jun 09 715 RUQ pain  ED DispositionAdmit Neil Crouch, PA11/28/21 2332 Roney Marion, MD MPH11/28/21 2354 Roney Marion, MD MPH11/29/21 [phone] Marla Roe, APRN11/29/21 [id number] Roney Marion, MD MPH11/29/21 0732 Laurian Brim, MD12/27/21 2122

## 2020-07-23 ENCOUNTER — Ambulatory Visit: Admit: 2020-07-23 | Payer: PRIVATE HEALTH INSURANCE | Attending: Internal Medicine

## 2020-07-23 DIAGNOSIS — Z20822 Contact with and (suspected) exposure to covid-19: Secondary | ICD-10-CM

## 2020-08-06 ENCOUNTER — Ambulatory Visit: Admit: 2020-08-06 | Payer: PRIVATE HEALTH INSURANCE | Attending: Internal Medicine

## 2020-08-06 DIAGNOSIS — Z20822 Contact with and (suspected) exposure to covid-19: Secondary | ICD-10-CM

## 2020-11-05 ENCOUNTER — Encounter: Admit: 2020-11-05 | Payer: PRIVATE HEALTH INSURANCE | Attending: Ophthalmology

## 2020-12-02 ENCOUNTER — Encounter: Admit: 2020-12-02 | Payer: PRIVATE HEALTH INSURANCE

## 2020-12-08 ENCOUNTER — Encounter: Admit: 2020-12-08 | Payer: PRIVATE HEALTH INSURANCE | Attending: Ophthalmology

## 2021-01-03 ENCOUNTER — Emergency Department (HOSPITAL_COMMUNITY): Payer: Medicaid Other

## 2021-01-03 ENCOUNTER — Emergency Department (HOSPITAL_COMMUNITY)
Admission: EM | Admit: 2021-01-03 | Discharge: 2021-01-04 | Disposition: A | Discharge status: Home / Self Care | Payer: Medicaid Other | Attending: Emergency Medicine | Admitting: Emergency Medicine

## 2021-01-03 ENCOUNTER — Encounter (HOSPITAL_COMMUNITY): Payer: Self-pay | Admitting: Emergency Medicine

## 2021-01-03 DIAGNOSIS — Z7722 Contact with and (suspected) exposure to environmental tobacco smoke (acute) (chronic): Secondary | ICD-10-CM | POA: Diagnosis not present

## 2021-01-03 DIAGNOSIS — W228XXA Striking against or struck by other objects, initial encounter: Secondary | ICD-10-CM | POA: Insufficient documentation

## 2021-01-03 DIAGNOSIS — S62317A Displaced fracture of base of fifth metacarpal bone. left hand, initial encounter for closed fracture: Secondary | ICD-10-CM | POA: Diagnosis not present

## 2021-01-03 DIAGNOSIS — S6992XA Unspecified injury of left wrist, hand and finger(s), initial encounter: Secondary | ICD-10-CM | POA: Diagnosis present

## 2021-01-03 DIAGNOSIS — S62327A Displaced fracture of shaft of fifth metacarpal bone, left hand, initial encounter for closed fracture: Secondary | ICD-10-CM

## 2021-01-03 MED ORDER — OXYCODONE-ACETAMINOPHEN 5-325 MG PO TABS
1.0000 | ORAL_TABLET | Freq: Once | ORAL | Status: AC
  Administered 2021-01-03: 1 via ORAL
  Filled 2021-01-03: qty 1

## 2021-01-03 MED ORDER — OXYCODONE HCL 5 MG PO TABS: 5.0000 mg | ORAL_TABLET | Freq: Four times a day (QID) | ORAL | 0 refills | Status: AC | PRN

## 2021-01-03 NOTE — ED Provider Notes (Signed)
[facility] EMERGENCY DEPARTMENT Provider Note   CSN: [id number] Arrival date & time: 01/03/21  2206     History Chief Complaint  Patient presents with   Hand Injury    Katelyn Hendricks is a 20 y.o. female with history of seizures, PTSD, ADHD, depression who presents to the emergency department with a chief complaint of left hand pain.  The patient reports that earlier she punched a door with her left hand while she was feeling angry.  She reports sudden onset pain after the incident accompanied by swelling to the left hand.  Pain is worse with movement.  Improved with holding her hand still.  She denies numbness, weakness, left wrist pain, right upper extremity pain, redness, fever, or chills.  No treatment prior to arrival.  Patient is left-hand dominant.  No previous fractures to the left upper extremity.  The history is provided by the patient and medical records. No language interpreter was used.      Past Medical History:  Diagnosis Date   ADHD    Depression    PTSD (post-traumatic stress disorder)    Seizures (HCC)     Patient Active Problem List   Diagnosis Date Noted   Other convulsions 10/25/2013   Migraine without aura, without mention of intractable migraine without mention of status migrainosus 10/25/2013    History reviewed. No pertinent surgical history.   OB History   No obstetric history on file.     Family History  Problem Relation Age of Onset   Migraines Mother    Seizures Mother    Migraines Maternal Grandmother    Migraines Maternal Aunt    Migraines Maternal Aunt    Seizures Cousin     Social History   Tobacco Use   Smoking status: Passive Smoke Exposure - Never Smoker   Smokeless tobacco: Never  Substance Use Topics   Alcohol use: No   Drug use: Yes    Types: Marijuana    Home Medications Prior to Admission medications   Medication Sig Start Date End Date Taking? Authorizing Provider  atomoxetine  (STRATTERA) 60 MG capsule Take 60 mg daily by mouth.    [provider]  citalopram (CELEXA) 10 MG/5ML suspension Take 20 mg daily by mouth.     [provider]  fluticasone (FLONASE) 50 MCG/ACT nasal spray Place 1 spray daily into both nostrils. 05/25/17   Caccavale, Sophia, PA-C  hydrOXYzine (ATARAX/VISTARIL) 50 MG tablet Take 50 mg every 12 (twelve) hours by mouth.    [provider]    Allergies    Patient has no known allergies.  Review of Systems   Review of Systems  Constitutional:  Negative for activity change, chills and fever.  Respiratory:  Negative for shortness of breath.   Cardiovascular:  Negative for chest pain.  Gastrointestinal:  Negative for abdominal pain, diarrhea, nausea and vomiting.  Musculoskeletal:  Positive for arthralgias and myalgias. Negative for back pain and neck pain.  Skin:  Negative for color change, rash and wound.  Neurological:  Negative for weakness and numbness.   Physical Exam Updated Vital Signs BP 94/64   Pulse (!) 102   Temp 98.3 F (36.8 C) (Oral)   Resp 16   LMP 12/08/2020   SpO2 97%   Physical Exam Vitals and nursing note reviewed.  Constitutional:      General: She is not in acute distress. HENT:     Head: Normocephalic.  Eyes:     Conjunctiva/sclera: Conjunctivae  normal.  Cardiovascular:     Rate and Rhythm: Normal rate and regular rhythm.     Heart sounds: No murmur heard.   No friction rub. No gallop.  Pulmonary:     Effort: Pulmonary effort is normal. No respiratory distress.  Abdominal:     General: There is no distension.     Palpations: Abdomen is soft.     Tenderness: There is no abdominal tenderness. There is no right CVA tenderness or guarding.  Musculoskeletal:     Cervical back: Neck supple.     Comments: Swelling noted to the dorsum of the left hand.  No redness or warmth.  Tender to palpation to the dorsum of the left hand over the fifth metacarpal.  Sensation is intact to all 4  distal aspects of the digits of the left hand.  Good capillary refill.  Radial pulses are 2+ and symmetric.  Decree strength against resistance of the digits of the left hand is secondary to pain.  Normal exam of the left wrist and elbow.  Muscular compartments are soft in the left forearm.  Skin:    General: Skin is warm.     Findings: No rash.  Neurological:     Mental Status: She is alert.  Psychiatric:        Behavior: Behavior normal.    ED Results / Procedures / Treatments   Labs (all labs ordered are listed, but only abnormal results are displayed) Labs Reviewed - No data to display  EKG None  Radiology DG Hand Complete Left  Result Date: 01/03/2021 CLINICAL DATA:  Punched door.  Pain EXAM: LEFT HAND - COMPLETE 3+ VIEW COMPARISON:  04/21/2014 FINDINGS: Fracture noted through the mid right 5th metacarpal with mild angulation and displacement. No subluxation or dislocation. IMPRESSION: Angulated and displaced left 5th metacarpal fracture Electronically Signed   By: Charlett Nose M.D.   On: 01/03/2021 22:45    Procedures Procedures   Medications Ordered in ED Medications - No data to display  ED Course  I have reviewed the triage vital signs and the nursing notes.  Pertinent labs & imaging results that were available during my care of the patient were reviewed by me and considered in my medical decision making (see chart for details).    MDM Rules/Calculators/A&P                          20 year old female presenting with sudden onset left hand pain after she punched a door earlier tonight.  She has pain and swelling to the left hand.  No other associated symptoms.  Vital signs are stable.  Tender to palpation over the fifth metacarpal on exam.  She does have decreased strength against resistance of the digits, but this appears secondary to pain.  Sensation is intact and she has good pulses and capillary refill.  X-ray has been reviewed and independently interpreted by  me.  There is an angulated, displaced left fifth metacarpal fracture.  No dislocation.  Discussed with Dr. Madilyn Hook, attending physician.  We will place the patient in an ulnar gutter splint.  She will follow-up with hand surgery.  Will discharge home with a short course of pain medication.  Supportive care discussed.  ER return precautions given.  She is hemodynamically stable no acute distress.  Safe for discharge home with outpatient follow-up as discussed.  Final Clinical Impression(s) / ED Diagnoses Final diagnoses:  None    Rx / DC Orders ED  Discharge Orders     None        Barkley Boards, PA-C 01/04/21 0005    Tilden Fossa, MD 01/05/21 [phone]

## 2021-01-03 NOTE — ED Triage Notes (Signed)
Patient punched a door this evening reports pain with mild swelling at left hand .

## 2021-01-03 NOTE — Discharge Instructions (Addendum)
Thank you for allowing me to care for you today in the Emergency Department.   You were seen today for hand injury.  You were found to have a fracture of the fifth metacarpal bone on your left hand.  This is also called a boxer's fracture.  You were placed in a splint today.  Call Dr. Austin Miles office to schedule a follow-up appointment.  I would recommend calling them tomorrow as they would like to see you probably within the next week and it can take several days to get an appointment.  Keep the splint clean and dry.  Cover with a plastic bag when he have to bathe or shower to avoid getting it wet.  Try to elevate your left hand so that your hand is above the level of your heart when you are sitting, resting, or sleeping to help with pain and swelling.  Take 650 mg of Tylenol or 600 mg of ibuprofen with food every 6 hours for pain.  You can alternate between these 2 medications every 3 hours if your pain returns.  For instance, you can take Tylenol at noon, followed by a dose of ibuprofen at 3, followed by second dose of Tylenol and 6.   For severe, uncontrollable pain, I have also called you in a short course of oxycodone.  This medication is a narcotic.  It can be addicting.  He should not take it with other medications or substances that can make you drowsy or sedated.  You can take 1 tablet every 6 hours as needed for severe, uncontrollable pain.  Do not work or drive while taking this medication as it can cause you to be drowsy and impaired.  Return to the emergency department if your fingers turn blue, if you develop severe redness, pain, swelling in your forearm, if you have any fall or injury, or have other new, concerning symptoms.

## 2022-07-11 IMAGING — CR DG HAND COMPLETE 3+V*L*
3 series · 3 of 3 positions shown · non-contrast
Comparison: 04/21/2014

CLINICAL DATA: Punched door.  Pain

EXAM:
LEFT HAND - COMPLETE 3+ VIEW

[hand pa]
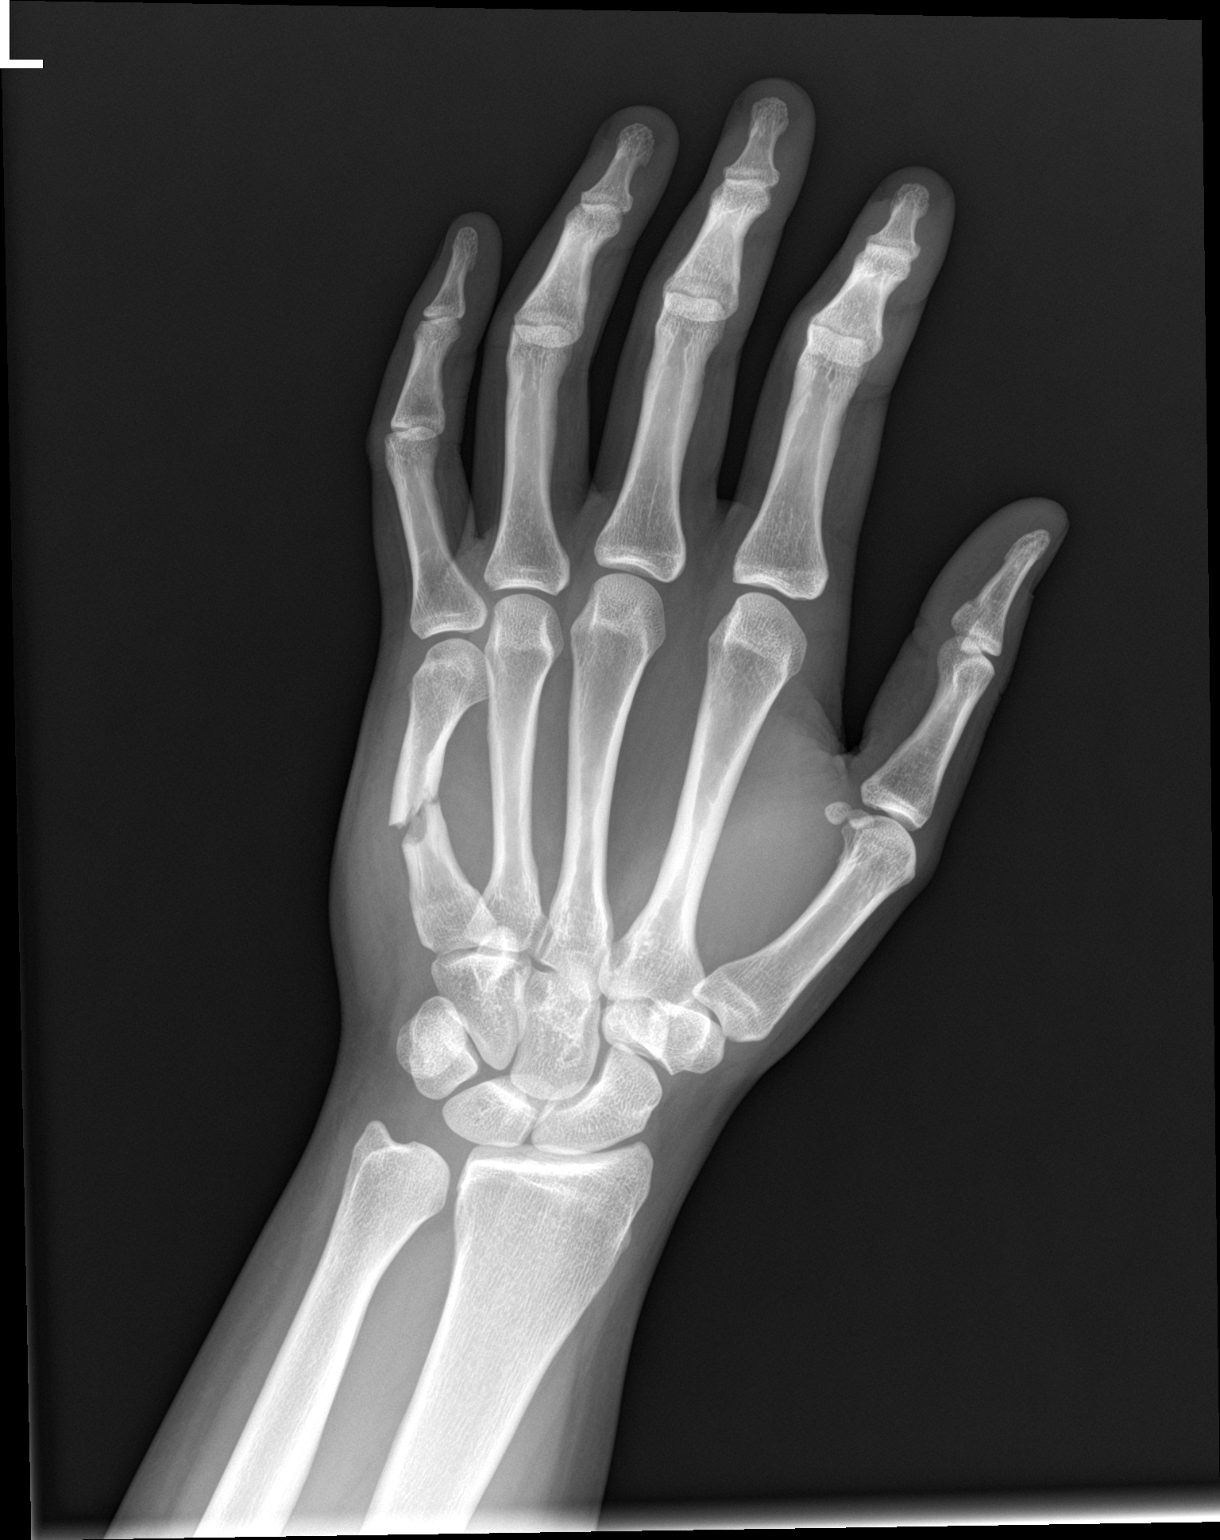

[hand obl]
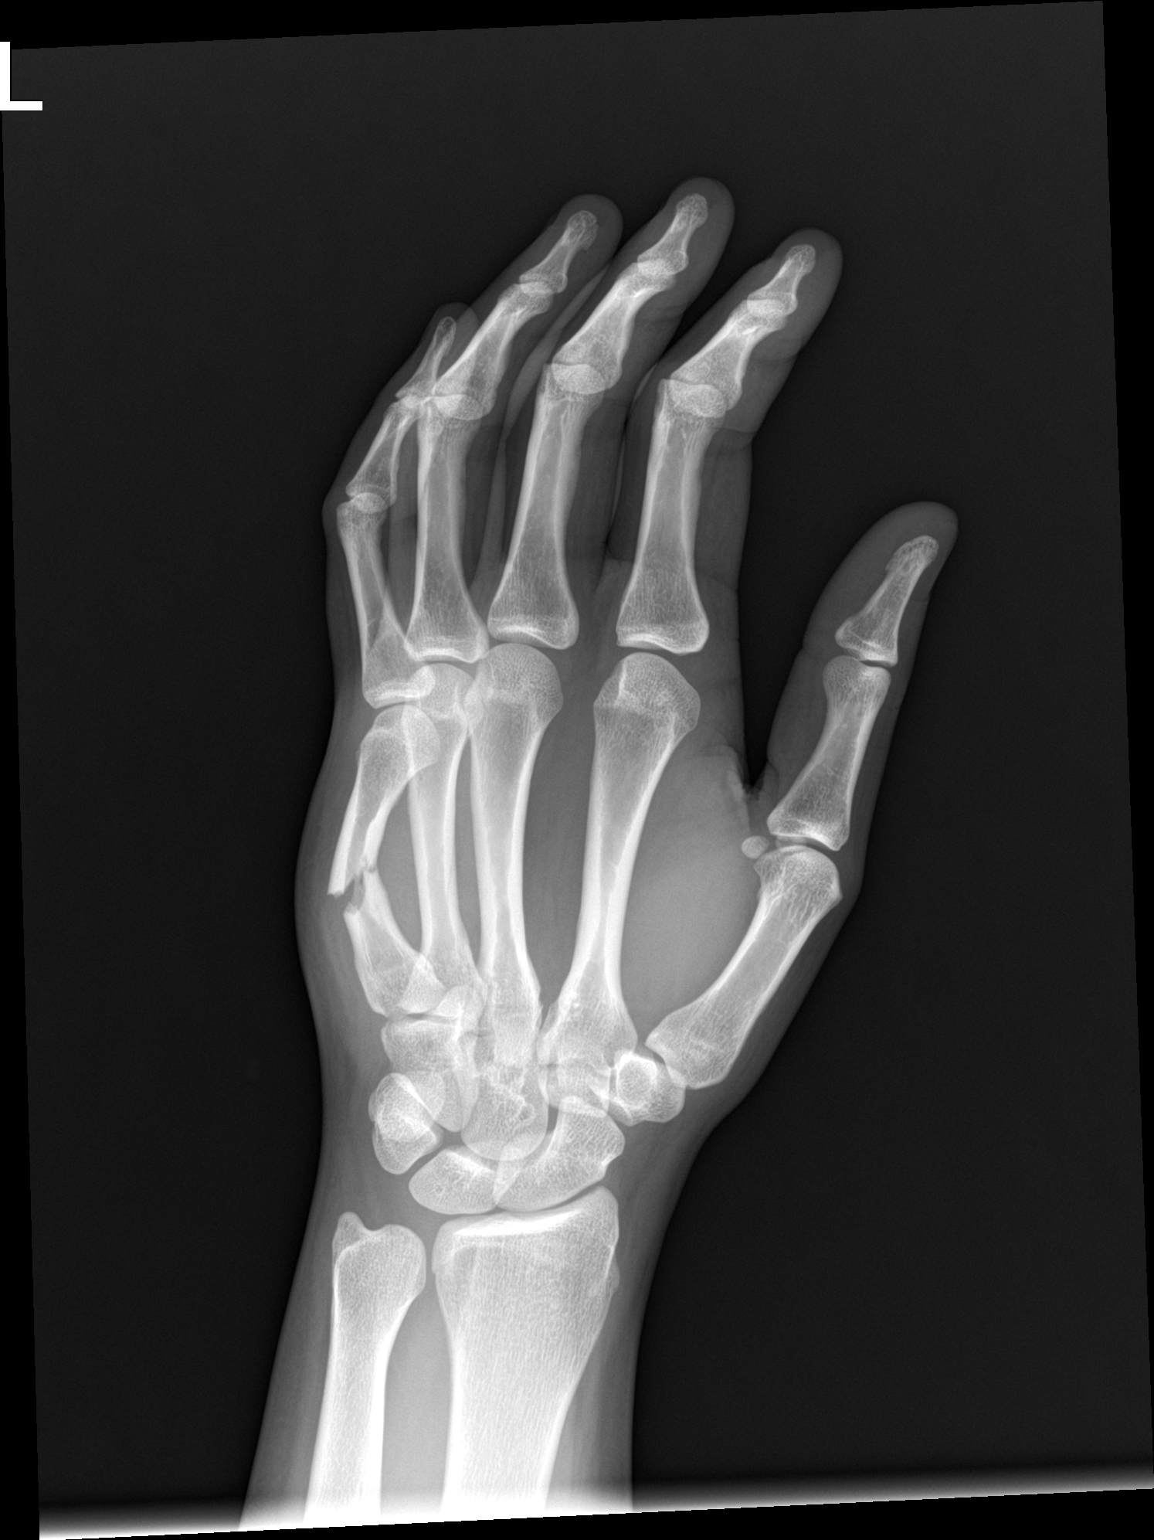

[hand lat]
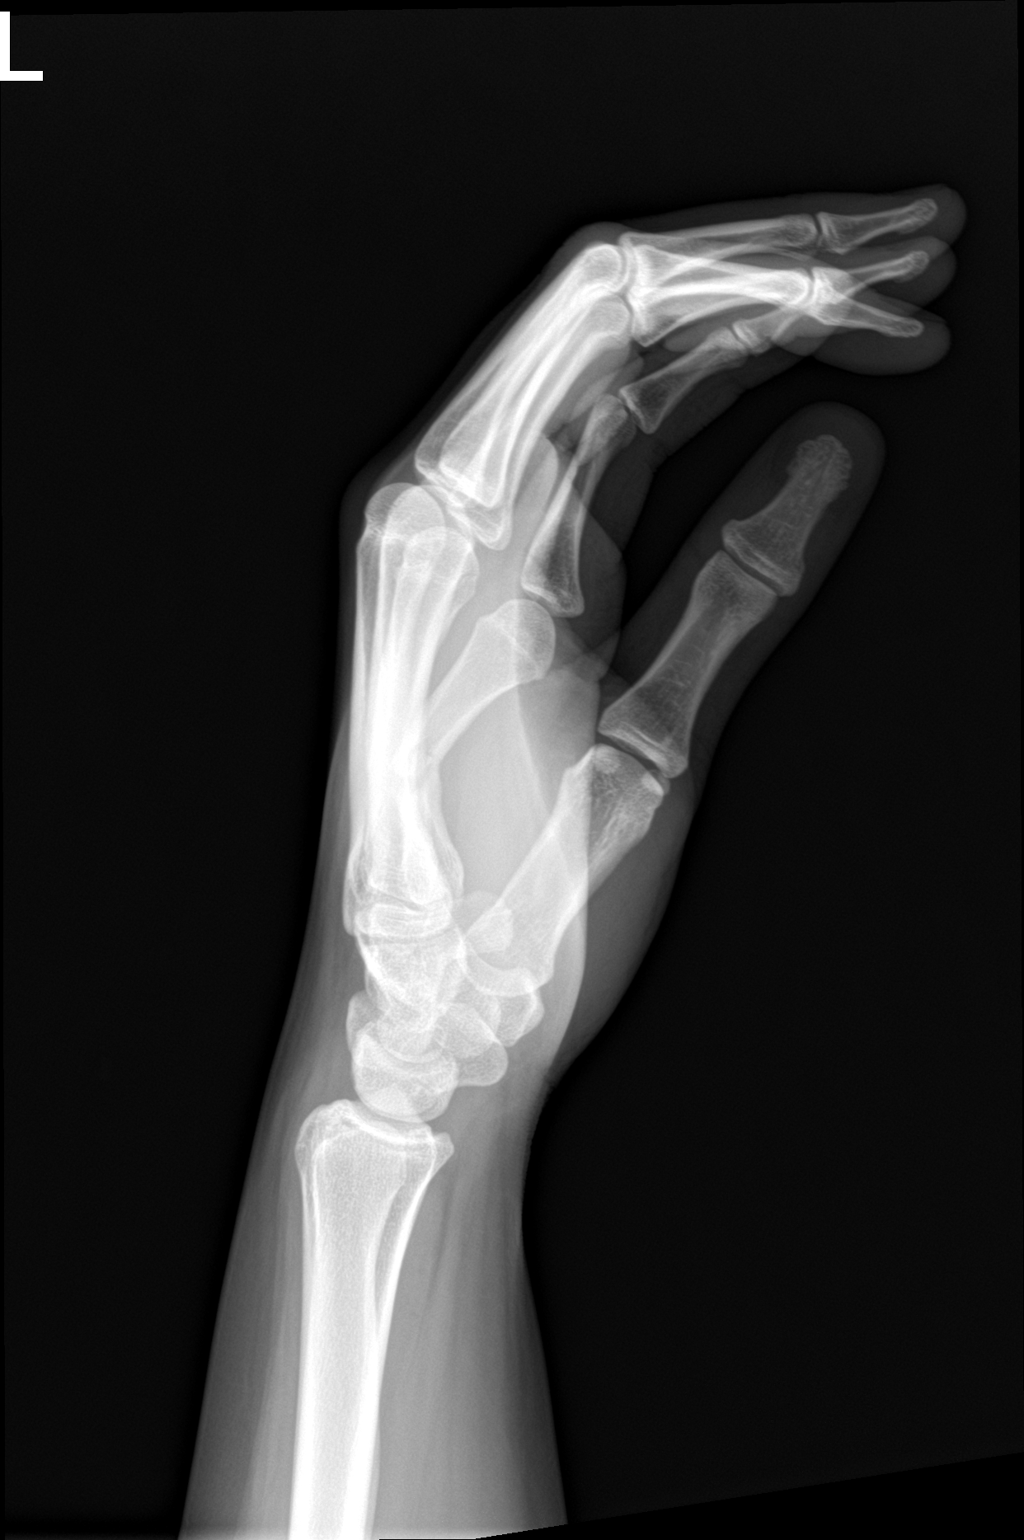

[3 of 3 positions shown; findings below may reference images not displayed]

FINDINGS: Fracture noted through the mid right 5th metacarpal with mild
angulation and displacement. No subluxation or dislocation.
IMPRESSION: Angulated and displaced left 5th metacarpal fracture
